# Patient Record
Sex: Female | Born: 1975 | Race: Black or African American | Hispanic: No | Marital: Single | Smoking: Current every day smoker
Health system: Southern US, Community
[De-identification: ages and names within clinical notes are randomized; demographics above are authoritative.]

---

## 2004-09-05 ENCOUNTER — Emergency Department: Payer: Self-pay | Admitting: Internal Medicine

## 2006-08-14 ENCOUNTER — Emergency Department: Payer: Self-pay | Admitting: General Practice

## 2011-05-05 ENCOUNTER — Emergency Department: Payer: Self-pay | Admitting: Emergency Medicine

## 2011-07-10 ENCOUNTER — Ambulatory Visit: Payer: Self-pay | Admitting: Family Medicine

## 2011-08-20 ENCOUNTER — Emergency Department: Payer: Self-pay | Admitting: Emergency Medicine

## 2012-04-21 ENCOUNTER — Emergency Department: Payer: Self-pay | Admitting: Emergency Medicine

## 2014-08-04 ENCOUNTER — Emergency Department (HOSPITAL_COMMUNITY)
Admission: EM | Admit: 2014-08-04 | Discharge: 2014-08-04 | Disposition: A | Discharge status: Home / Self Care | Payer: No Typology Code available for payment source | Attending: Emergency Medicine | Admitting: Emergency Medicine

## 2014-08-04 ENCOUNTER — Emergency Department (HOSPITAL_COMMUNITY): Payer: No Typology Code available for payment source

## 2014-08-04 ENCOUNTER — Encounter (HOSPITAL_COMMUNITY): Payer: Self-pay | Admitting: Emergency Medicine

## 2014-08-04 DIAGNOSIS — Y9389 Activity, other specified: Secondary | ICD-10-CM | POA: Diagnosis not present

## 2014-08-04 DIAGNOSIS — Y998 Other external cause status: Secondary | ICD-10-CM | POA: Insufficient documentation

## 2014-08-04 DIAGNOSIS — Y92410 Unspecified street and highway as the place of occurrence of the external cause: Secondary | ICD-10-CM | POA: Insufficient documentation

## 2014-08-04 DIAGNOSIS — S59901A Unspecified injury of right elbow, initial encounter: Secondary | ICD-10-CM

## 2014-08-04 DIAGNOSIS — S6991XA Unspecified injury of right wrist, hand and finger(s), initial encounter: Secondary | ICD-10-CM | POA: Diagnosis present

## 2014-08-04 MED ORDER — HYDROCODONE-ACETAMINOPHEN 5-325 MG PO TABS: 2.0000 | ORAL_TABLET | ORAL | Status: DC | PRN

## 2014-08-04 MED ORDER — HYDROCODONE-ACETAMINOPHEN 5-325 MG PO TABS
2.0000 | ORAL_TABLET | Freq: Once | ORAL | Status: AC
  Administered 2014-08-04: 2 via ORAL
  Filled 2014-08-04: qty 2

## 2014-08-04 NOTE — Discharge Instructions (Signed)
Rest, ice and elevate your hand for symptom relief. Wear splint as needed for support. Refer to attached documents for more information.
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
## 2014-08-04 NOTE — Progress Notes (Signed)
Orthopedic Tech Progress Note Patient Details:  Alice Peterson 05-05-1976 [id number]  Ortho Devices Type of Ortho Device: Thumb velcro splint Ortho Device/Splint Location: rue Ortho Device/Splint Interventions: Application   [name] 08/04/2014, 8:35 AM

## 2014-08-04 NOTE — ED Notes (Signed)
Ortho at bedside.
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
## 2014-08-13 ENCOUNTER — Emergency Department (INDEPENDENT_AMBULATORY_CARE_PROVIDER_SITE_OTHER)
Admission: EM | Admit: 2014-08-13 | Discharge: 2014-08-13 | Disposition: A | Discharge status: Home / Self Care | Payer: BLUE CROSS/BLUE SHIELD | Source: Home / Self Care | Attending: Family Medicine | Admitting: Family Medicine

## 2014-08-13 ENCOUNTER — Encounter (HOSPITAL_COMMUNITY): Payer: Self-pay

## 2014-08-13 DIAGNOSIS — M5432 Sciatica, left side: Secondary | ICD-10-CM

## 2014-08-13 DIAGNOSIS — M5431 Sciatica, right side: Secondary | ICD-10-CM

## 2014-08-13 MED ORDER — NAPROXEN 500 MG PO TABS: 500.0000 mg | ORAL_TABLET | Freq: Two times a day (BID) | ORAL | Status: DC
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
## 2014-08-13 NOTE — Discharge Instructions (Signed)
Back Exercises Back exercises help treat and prevent back injuries. The goal of back exercises is to increase the strength of your abdominal and back muscles and the flexibility of your back. These exercises should be started when you no longer have back pain. Back exercises include:  Pelvic Tilt. Lie on your back with your knees bent. Tilt your pelvis until the lower part of your back is against the floor. Hold this position 5 to 10 sec and repeat 5 to 10 times.  Knee to Chest. Pull first 1 knee up against your chest and hold for 20 to 30 seconds, repeat this with the other knee, and then both knees. This may be done with the other leg straight or bent, whichever feels better.  Sit-Ups or Curl-Ups. Bend your knees 90 degrees. Start with tilting your pelvis, and do a partial, slow sit-up, lifting your trunk only 30 to 45 degrees off the floor. Take at least 2 to 3 seconds for each sit-up. Do not do sit-ups with your knees out straight. If partial sit-ups are difficult, simply do the above but with only tightening your abdominal muscles and holding it as directed.  Hip-Lift. Lie on your back with your knees flexed 90 degrees. Push down with your feet and shoulders as you raise your hips a couple inches off the floor; hold for 10 seconds, repeat 5 to 10 times.  Back arches. Lie on your stomach, propping yourself up on bent elbows. Slowly press on your hands, causing an arch in your low back. Repeat 3 to 5 times. Any initial stiffness and discomfort should lessen with repetition over time.  Shoulder-Lifts. Lie face down with arms beside your body. Keep hips and torso pressed to floor as you slowly lift your head and shoulders off the floor. Do not overdo your exercises, especially in the beginning. Exercises may cause you some mild back discomfort which lasts for a few minutes; however, if the pain is more severe, or lasts for more than 15 minutes, do not continue exercises until you see your caregiver.  Improvement with exercise therapy for back problems is slow.  See your caregivers for assistance with developing a proper back exercise program. Document Released: 06/25/2004 Document Revised: 08/10/2011 Document Reviewed: 03/19/2011 ExitCare Patient Information 2015 [employer]. This information is not intended to replace advice given to you by your health care provider. Make sure you discuss any questions you have with your health care provider.   Sciatica Sciatica is pain, weakness, numbness, or tingling along the path of the sciatic nerve. The nerve starts in the lower back and runs down the back of each leg. The nerve controls the muscles in the lower leg and in the back of the knee, while also providing sensation to the back of the thigh, lower leg, and the sole of your foot. Sciatica is a symptom of another medical condition. For instance, nerve damage or certain conditions, such as a herniated disk or bone spur on the spine, pinch or put pressure on the sciatic nerve. This causes the pain, weakness, or other sensations normally associated with sciatica. Generally, sciatica only affects one side of the body. CAUSES   Herniated or slipped disc.  Degenerative disk disease.  A pain disorder involving the narrow muscle in the buttocks (piriformis syndrome).  Pelvic injury or fracture.  Pregnancy.  Tumor (rare). SYMPTOMS  Symptoms can vary from mild to very severe. The symptoms usually travel from the low back to the buttocks and down the back   of the leg. Symptoms can include:  Mild tingling or dull aches in the lower back, leg, or hip.  Numbness in the back of the calf or sole of the foot.  Burning sensations in the lower back, leg, or hip.  Sharp pains in the lower back, leg, or hip.  Leg weakness.  Severe back pain inhibiting movement. These symptoms may get worse with coughing, sneezing, laughing, or prolonged sitting or standing. Also, being overweight may worsen  symptoms. DIAGNOSIS  Your caregiver will perform a physical exam to look for common symptoms of sciatica. He or she may ask you to do certain movements or activities that would trigger sciatic nerve pain. Other tests may be performed to find the cause of the sciatica. These may include:  Blood tests.  X-rays.  Imaging tests, such as an MRI or CT scan. TREATMENT  Treatment is directed at the cause of the sciatic pain. Sometimes, treatment is not necessary and the pain and discomfort goes away on its own. If treatment is needed, your caregiver may suggest:  Over-the-counter medicines to relieve pain.  Prescription medicines, such as anti-inflammatory medicine, muscle relaxants, or narcotics.  Applying heat or ice to the painful area.  Steroid injections to lessen pain, irritation, and inflammation around the nerve.  Reducing activity during periods of pain.  Exercising and stretching to strengthen your abdomen and improve flexibility of your spine. Your caregiver may suggest losing weight if the extra weight makes the back pain worse.  Physical therapy.  Surgery to eliminate what is pressing or pinching the nerve, such as a bone spur or part of a herniated disk. HOME CARE INSTRUCTIONS   Only take over-the-counter or prescription medicines for pain or discomfort as directed by your caregiver.  Apply ice to the affected area for 20 minutes, 3-4 times a [place] for the first 48-72 hours. Then try heat in the same way.  Exercise, stretch, or perform your usual activities if these do not aggravate your pain.  Attend physical therapy sessions as directed by your caregiver.  Keep all follow-up appointments as directed by your caregiver.  Do not wear high heels or shoes that do not provide proper support.  Check your mattress to see if it is too soft. A firm mattress may lessen your pain and discomfort. SEEK IMMEDIATE MEDICAL CARE IF:   You lose control of your bowel or bladder  (incontinence).  You have increasing weakness in the lower back, pelvis, buttocks, or legs.  You have redness or swelling of your back.  You have a burning sensation when you urinate.  You have pain that gets worse when you lie down or awakens you at night.  Your pain is worse than you have experienced in the past.  Your pain is lasting longer than 4 weeks.  You are suddenly losing weight without reason. MAKE SURE YOU:  Understand these instructions.  Will watch your condition.  Will get help right away if you are not doing well or get worse. Document Released: 05/12/2001 Document Revised: 11/17/2011 Document Reviewed: 09/27/2011 ExitCare Patient Information 2015 [employer]. This information is not intended to replace advice given to you by your health care provider. Make sure you discuss any questions you have with your health care provider.  

## 2014-09-07 ENCOUNTER — Emergency Department (INDEPENDENT_AMBULATORY_CARE_PROVIDER_SITE_OTHER)
Admission: EM | Admit: 2014-09-07 | Discharge: 2014-09-07 | Disposition: A | Discharge status: Home / Self Care | Payer: BLUE CROSS/BLUE SHIELD | Source: Home / Self Care | Attending: Family Medicine | Admitting: Family Medicine

## 2014-09-07 ENCOUNTER — Encounter (HOSPITAL_COMMUNITY): Payer: Self-pay

## 2014-09-07 DIAGNOSIS — M545 Low back pain, unspecified: Secondary | ICD-10-CM

## 2014-09-07 MED ORDER — CYCLOBENZAPRINE HCL 5 MG PO TABS: 5.0000 mg | ORAL_TABLET | Freq: Every evening | ORAL | Status: AC | PRN

## 2014-09-07 MED ORDER — NAPROXEN 500 MG PO TABS: 500.0000 mg | ORAL_TABLET | Freq: Two times a day (BID) | ORAL | Status: AC

## 2014-09-07 MED ORDER — NAPROXEN 500 MG PO TABS: 500.0000 mg | ORAL_TABLET | Freq: Two times a day (BID) | ORAL | Status: DC

## 2014-09-07 NOTE — Discharge Instructions (Signed)
Thank you for coming in today. °Come back or go to the emergency room if you notice new weakness new numbness problems walking or bowel or bladder problems. ° ° °Back Exercises °These exercises may help you when beginning to rehabilitate your injury. Your symptoms may resolve with or without further involvement from your physician, physical therapist or athletic trainer. While completing these exercises, remember:  °· Restoring tissue flexibility helps normal motion to return to the joints. This allows healthier, less painful movement and activity. °· An effective stretch should be held for at least 30 seconds. °· A stretch should never be painful. You should only feel a gentle lengthening or release in the stretched tissue. °STRETCH - Extension, Prone on Elbows  °· Lie on your stomach on the floor, a bed will be too soft. Place your palms about shoulder width apart and at the height of your head. °· Place your elbows under your shoulders. If this is too painful, stack pillows under your chest. °· Allow your body to relax so that your hips drop lower and make contact more completely with the floor. °· Hold this position for __________ seconds. °· Slowly return to lying flat on the floor. °Repeat __________ times. Complete this exercise __________ times per Kiene.  °RANGE OF MOTION - Extension, Prone Press Ups  °· Lie on your stomach on the floor, a bed will be too soft. Place your palms about shoulder width apart and at the height of your head. °· Keeping your back as relaxed as possible, slowly straighten your elbows while keeping your hips on the floor. You may adjust the placement of your hands to maximize your comfort. As you gain motion, your hands will come more underneath your shoulders. °· Hold this position __________ seconds. °· Slowly return to lying flat on the floor. °Repeat __________ times. Complete this exercise __________ times per Dupuis.  °RANGE OF MOTION- Quadruped, Neutral Spine  °· Assume a hands  and knees position on a firm surface. Keep your hands under your shoulders and your knees under your hips. You may place padding under your knees for comfort. °· Drop your head and point your tail bone toward the ground below you. This will round out your low back like an angry cat. Hold this position for __________ seconds. °· Slowly lift your head and release your tail bone so that your back sags into a large arch, like an old horse. °· Hold this position for __________ seconds. °· Repeat this until you feel limber in your low back. °· Now, find your "sweet spot." This will be the most comfortable position somewhere between the two previous positions. This is your neutral spine. Once you have found this position, tense your stomach muscles to support your low back. °· Hold this position for __________ seconds. °Repeat __________ times. Complete this exercise __________ times per Kronberg.  °STRETCH - Flexion, Single Knee to Chest  °· Lie on a firm bed or floor with both legs extended in front of you. °· Keeping one leg in contact with the floor, bring your opposite knee to your chest. Hold your leg in place by either grabbing behind your thigh or at your knee. °· Pull until you feel a gentle stretch in your low back. Hold __________ seconds. °· Slowly release your grasp and repeat the exercise with the opposite side. °Repeat __________ times. Complete this exercise __________ times per Dolin.  °STRETCH - Hamstrings, Standing °· Stand or sit and extend your right / left   leg, placing your foot on a chair or foot stool °· Keeping a slight arch in your low back and your hips straight forward. °· Lead with your chest and lean forward at the waist until you feel a gentle stretch in the back of your right / left knee or thigh. (When done correctly, this exercise requires leaning only a small distance.) °· Hold this position for __________ seconds. °Repeat __________ times. Complete this stretch __________ times per  Hachey. °STRENGTHENING - Deep Abdominals, Pelvic Tilt  °· Lie on a firm bed or floor. Keeping your legs in front of you, bend your knees so they are both pointed toward the ceiling and your feet are flat on the floor. °· Tense your lower abdominal muscles to press your low back into the floor. This motion will rotate your pelvis so that your tail bone is scooping upwards rather than pointing at your feet or into the floor. °· With a gentle tension and even breathing, hold this position for __________ seconds. °Repeat __________ times. Complete this exercise __________ times per Creswell.  °STRENGTHENING - Abdominals, Crunches  °· Lie on a firm bed or floor. Keeping your legs in front of you, bend your knees so they are both pointed toward the ceiling and your feet are flat on the floor. Cross your arms over your chest. °· Slightly tip your chin down without bending your neck. °· Tense your abdominals and slowly lift your trunk high enough to just clear your shoulder blades. Lifting higher can put excessive stress on the low back and does not further strengthen your abdominal muscles. °· Control your return to the starting position. °Repeat __________ times. Complete this exercise __________ times per Dyck.  °STRENGTHENING - Quadruped, Opposite UE/LE Lift  °· Assume a hands and knees position on a firm surface. Keep your hands under your shoulders and your knees under your hips. You may place padding under your knees for comfort. °· Find your neutral spine and gently tense your abdominal muscles so that you can maintain this position. Your shoulders and hips should form a rectangle that is parallel with the floor and is not twisted. °· Keeping your trunk steady, lift your right hand no higher than your shoulder and then your left leg no higher than your hip. Make sure you are not holding your breath. Hold this position __________ seconds. °· Continuing to keep your abdominal muscles tense and your back steady, slowly return  to your starting position. Repeat with the opposite arm and leg. °Repeat __________ times. Complete this exercise __________ times per Garde. °Document Released: 06/05/2005 Document Revised: 08/10/2011 Document Reviewed: 08/30/2008 °ExitCare® Patient Information ©2015 [employer]. This information is not intended to replace advice given to you by your health care provider. Make sure you discuss any questions you have with your health care provider. ° °
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
## 2014-12-25 ENCOUNTER — Encounter (HOSPITAL_COMMUNITY): Payer: Self-pay | Admitting: Emergency Medicine

## 2014-12-25 ENCOUNTER — Emergency Department (INDEPENDENT_AMBULATORY_CARE_PROVIDER_SITE_OTHER)
Admission: EM | Admit: 2014-12-25 | Discharge: 2014-12-25 | Disposition: A | Discharge status: Home / Self Care | Payer: BLUE CROSS/BLUE SHIELD | Source: Home / Self Care | Attending: Emergency Medicine | Admitting: Emergency Medicine

## 2014-12-25 DIAGNOSIS — T7029XA Other effects of high altitude, initial encounter: Secondary | ICD-10-CM

## 2014-12-25 DIAGNOSIS — H722X1 Other marginal perforations of tympanic membrane, right ear: Secondary | ICD-10-CM

## 2014-12-25 NOTE — Discharge Instructions (Signed)
Eardrum Perforation °The eardrum is a thin, round tissue inside the ear that separates the ear canal from the middle ear. This is the tissue that detects sound and enables you to hear. The eardrum can be punctured or torn (perforated). Eardrums generally heal without help and with little or no permanent hearing loss. °CAUSES  °· Sudden pressure changes that happen in situations like scuba diving or flying in an airplane. °· Foreign objects in the ear. °· Inserting a cotton-tipped swab in the ear. °· Loud noise. °· Trauma to the ear. °SYMPTOMS  °· Hearing loss. °· Ear pain. °· Ringing in the ears. °· Discharge or bleeding from the ear. °· Dizziness. °· Vomiting. °· Facial paralysis. °HOME CARE INSTRUCTIONS  °· Keep your ear dry, as this improves healing. Swimming, diving, and showers are not allowed until healing is complete. While bathing, protect the ear by placing a piece of cotton covered with petroleum jelly in the outer ear canal. °· Only take over-the-counter or prescription medicines for pain, discomfort, or fever as directed by your caregiver. °· Blow your nose gently. Forceful blowing increases the pressure in the middle ear and may cause further injury or delay healing. °· Resume normal activities, such as showering, when the perforation has healed. Your caregiver can let you know when this has occurred. °· Talk to your caregiver before flying on an airplane. Air travel is generally allowed with a perforated eardrum. °· If your caregiver has given you a follow-up appointment, it is very important to keep that appointment. Failure to keep the appointment could result in a chronic or permanent injury, pain, hearing loss, and disability. °SEEK IMMEDIATE MEDICAL CARE IF:  °· You have bleeding or pus coming from your ear. °· You have problems with balance, dizziness, nausea, or vomiting. °· You develop increased pain. °· You have a fever. °MAKE SURE YOU:  °· Understand these instructions. °· Will watch your  condition. °· Will get help right away if you are not doing well or get worse. °Document Released: 05/15/2000 Document Revised: 08/10/2011 Document Reviewed: 05/17/2008 °ExitCare® Patient Information ©2015 [employer]. This information is not intended to replace advice given to you by your health care provider. Make sure you discuss any questions you have with your health care provider. ° °
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
## 2020-01-24 ENCOUNTER — Ambulatory Visit (INDEPENDENT_AMBULATORY_CARE_PROVIDER_SITE_OTHER): Payer: Medicaid Other | Admitting: Advanced Practice Midwife

## 2020-01-24 ENCOUNTER — Other Ambulatory Visit (HOSPITAL_COMMUNITY)
Admission: RE | Admit: 2020-01-24 | Discharge: 2020-01-24 | Disposition: A | Discharge status: Home / Self Care | Payer: Medicaid Other | Source: Ambulatory Visit | Attending: Advanced Practice Midwife | Admitting: Advanced Practice Midwife

## 2020-01-24 ENCOUNTER — Other Ambulatory Visit: Payer: Self-pay

## 2020-01-24 ENCOUNTER — Encounter: Payer: Self-pay | Admitting: Advanced Practice Midwife

## 2020-01-24 VITALS — BP 108/72 | HR 76 | Ht 66.0 in | Wt 198.0 lb

## 2020-01-24 DIAGNOSIS — Z124 Encounter for screening for malignant neoplasm of cervix: Secondary | ICD-10-CM | POA: Diagnosis not present

## 2020-01-24 DIAGNOSIS — Z113 Encounter for screening for infections with a predominantly sexual mode of transmission: Secondary | ICD-10-CM

## 2020-01-24 DIAGNOSIS — Z0000 Encounter for general adult medical examination without abnormal findings: Secondary | ICD-10-CM | POA: Diagnosis present

## 2020-01-24 DIAGNOSIS — Z308 Encounter for other contraceptive management: Secondary | ICD-10-CM

## 2020-01-24 NOTE — Patient Instructions (Signed)
Key Things to Know About COVID-19 Vaccines  Effectiveness  Studies show that COVID-19 vaccines are effective at keeping you from getting COVID-19. Getting a COVID-19 vaccine will also help keep you from getting seriously ill even if you do get COVID-19.  COVID-19 vaccines teach our immune systems how to recognize and fight the virus that causes COVID-19. It typically takes 2 weeks after vaccination for the body to build protection (immunity) against the virus that causes COVID-19. That means it is possible a person could still get COVID-19 before or just after vaccination and then get sick because the vaccine did not have enough time to build protection. People are considered fully vaccinated 2 weeks after their second dose of the Pfizer-BioNTech or Moderna COVID-19 vaccines, or 2 weeks after the single-dose [employer] COVID-19 vaccine.  Safety  Millions of people in the [place] have received COVID-19 vaccines, and these vaccines have undergone the most intensive safety monitoring in [place] history. This monitoring includes using both established and new safety monitoring systems to make sure that COVID-19 vaccines are safe. COVID-19 vaccines cannot give you COVID-19.  You may have side effects after vaccination, but these are normal  After COVID-19 vaccination, you may have some side effects. These are normal signs that your body is building protection. The side effects from COVID-19 vaccination, such as tiredness, headache, or chills, may affect your ability to do daily activities, but they should go away in a few days  Population Immunity  Population immunity makes it hard for a disease to spread from person to person. It even protects those who cannot be vaccinated, like newborns or people who are allergic to a vaccine. The percentage of people who need to have protection to achieve population immunity varies by disease.

## 2020-01-24 NOTE — Progress Notes (Signed)
GYNECOLOGY ANNUAL PREVENTATIVE CARE ENCOUNTER NOTE  Subjective:   Alice Peterson is a 44 y.o. G13P0 female here for a routine annual gynecologic exam.  Current complaints: brown spotting.   Denies abnormal vaginal bleeding, discharge, pelvic pain, problems with intercourse or other gynecologic concerns.   Patient states that she self treated with OTC monistat for a yeast infection. Afterwards she had some brown spotting for a few days. This has since resolved.   Patient reports that she has been doing through menopause around age 36. She reports that her mom also went through menopause in her 27's.   Has had first dose of the pfizer vaccine. Due for dose 2 next week. Planning to get it as scheduled.   Gynecologic History Post-menopausal last period was approx 4 years ago Contraception: none Last Pap: 4-5 years ago. Results were: unsure  Last mammogram: Has not had one. Results were: NA  Obstetric History OB History  Gravida Para Term Preterm AB Living  2         2  SAB TAB Ectopic Multiple Live Births               # Outcome Date GA Lbr Len/2nd Weight Sex Delivery Anes PTL Lv  2 Gravida           1 Gravida             History reviewed. No pertinent past medical history.  History reviewed. No pertinent surgical history.  Current Outpatient Medications on File Prior to Visit  Medication Sig Dispense Refill  . cyclobenzaprine (FLEXERIL) 5 MG tablet Take 1 tablet (5 mg total) by mouth at bedtime as needed for muscle spasms. (Patient not taking: Reported on 01/24/2020) 20 tablet 0  . naproxen (NAPROSYN) 500 MG tablet Take 1 tablet (500 mg total) by mouth 2 (two) times daily with a meal. (Patient not taking: Reported on 01/24/2020) 30 tablet 0  . OVER THE COUNTER MEDICATION Swimmers ear drops-otc (Patient not taking: Reported on 01/24/2020)     No current facility-administered medications on file prior to visit.    Allergies  Allergen Reactions  . Peanut-Containing Drug Products  Anaphylaxis    Pecans only     Social History   Socioeconomic History  . Marital status: Single    Spouse name: Not on file  . Number of children: Not on file  . Years of education: Not on file  . Highest education level: Not on file  Occupational History  . Not on file  Tobacco Use  . Smoking status: Current Every [place] Smoker    Types: Cigarettes  . Smokeless tobacco: Never Used  Vaping Use  . Vaping Use: Never used  Substance and Sexual Activity  . Alcohol use: Yes  . Drug use: No  . Sexual activity: Not Currently    Partners: Male    Birth control/protection: None  Other Topics Concern  . Not on file  Social History Narrative  . Not on file   Social Determinants of Health   Financial Resource Strain:   . Difficulty of Paying Living Expenses: Not on file  Food Insecurity:   . Worried About Programme researcher, broadcasting/film/video in the Last Year: Not on file  . Ran Out of Food in the Last Year: Not on file  Transportation Needs:   . Lack of Transportation (Medical): Not on file  . Lack of Transportation (Non-Medical): Not on file  Physical Activity:   . Days of Exercise per Week: Not  on file  . Minutes of Exercise per Session: Not on file  Stress:   . Feeling of Stress : Not on file  Social Connections:   . Frequency of Communication with Friends and Family: Not on file  . Frequency of Social Gatherings with Friends and Family: Not on file  . Attends Religious Services: Not on file  . Active Member of Clubs or Organizations: Not on file  . Attends Banker Meetings: Not on file  . Marital Status: Not on file  Intimate Partner Violence:   . Fear of Current or Ex-Partner: Not on file  . Emotionally Abused: Not on file  . Physically Abused: Not on file  . Sexually Abused: Not on file    No family history on file.  The following portions of the patient's history were reviewed and updated as appropriate: allergies, current medications, past family history, past  medical history, past social history, past surgical history and problem list.  Review of Systems Pertinent items noted in HPI and remainder of comprehensive ROS otherwise negative.   Objective:  BP 108/72   Pulse 76   Ht 5\' 6"  (1.676 m)   Wt 198 lb (89.8 kg)   BMI 31.96 kg/m  CONSTITUTIONAL: Well-developed, well-nourished female in no acute distress.  HENT:  Normocephalic, atraumatic, External right and left ear normal. Oropharynx is clear and moist EYES: Conjunctivae and EOM are normal. Pupils are equal, round, and reactive to light. No scleral icterus.  NECK: Normal range of motion, supple, no masses.  Normal thyroid.  SKIN: Skin is warm and dry. No rash noted. Not diaphoretic. No erythema. No pallor. NEUROLOGIC: Alert and oriented to person, place, and time. Normal reflexes, muscle tone coordination. No cranial nerve deficit noted. PSYCHIATRIC: Normal mood and affect. Normal behavior. Normal judgment and thought content. CARDIOVASCULAR: Normal heart rate noted, regular rhythm RESPIRATORY: Clear to auscultation bilaterally. Effort and breath sounds normal, no problems with respiration noted. BREASTS: Symmetric in size. No masses, skin changes, nipple drainage, or lymphadenopathy. ABDOMEN: Soft, normal bowel sounds, no distention noted.  No tenderness, rebound or guarding.  PELVIC: Normal appearing external genitalia; normal appearing vaginal mucosa and cervix.  No abnormal discharge noted.  Pap smear obtained.  Normal uterine size, no other palpable masses, no uterine or adnexal tenderness. MUSCULOSKELETAL: Normal range of motion. No tenderness.  No cyanosis, clubbing, or edema.  2+ distal pulses.   Assessment and Plan:  1. Screening for cervical cancer - Cytology - PAP  2. Encounter for annual physical examination excluding gynecological examination in a patient older than 17 years - Cervicovaginal ancillary only( [place]) - MM DIGITAL SCREENING BILATERAL; Future - Covid  vaccine recommended   Will follow up results of pap smear and manage accordingly. Mammogram scheduled Routine preventative health maintenance measures emphasized. Please refer to After Visit Summary for other counseling recommendations.    DNP, CNM  01/24/20  10:40 AM

## 2020-01-24 NOTE — Progress Notes (Signed)
NGYN patient presents for Annual Exam.  Last Pap: 4 yrs ago WNL  LMP: No cycle in 4 yrs  Contraception: None  STD Screening: Desires Full Panel.  Mammogram:  Never  Family Hx of Breast Cancer:  None   CC: Patient states x 1 month ago  She had a vaginal yeast infection pt tired OTC monistat sx's recurred. pt then began to have spotting this past July after not having a cycle in 4 yrs blood was "Brownish" color. Pt notes vaginal odor as well wants vaginal swab.

## 2020-01-25 LAB — CYTOLOGY - PAP
Adequacy: ABSENT
Comment: NEGATIVE
Diagnosis: NEGATIVE
High risk HPV: NEGATIVE

## 2020-01-25 LAB — CERVICOVAGINAL ANCILLARY ONLY
Bacterial Vaginitis (gardnerella): POSITIVE — AB
Candida Glabrata: NEGATIVE
Candida Vaginitis: NEGATIVE
Chlamydia: NEGATIVE
Comment: NEGATIVE
Comment: NEGATIVE
Comment: NEGATIVE
Comment: NEGATIVE
Comment: NEGATIVE
Comment: NORMAL
Neisseria Gonorrhea: NEGATIVE
Trichomonas: POSITIVE — AB

## 2020-01-25 LAB — RPR: RPR Ser Ql: NONREACTIVE

## 2020-01-25 LAB — HIV ANTIBODY (ROUTINE TESTING W REFLEX): HIV Screen 4th Generation wRfx: NONREACTIVE

## 2020-01-28 ENCOUNTER — Encounter: Payer: Self-pay | Admitting: Advanced Practice Midwife

## 2020-01-30 ENCOUNTER — Other Ambulatory Visit: Payer: Self-pay | Admitting: *Deleted

## 2020-01-30 MED ORDER — METRONIDAZOLE 500 MG PO TABS: 2000.0000 mg | ORAL_TABLET | Freq: Once | ORAL | 0 refills | Status: AC

## 2020-01-30 NOTE — Progress Notes (Signed)
2 gram flagyl sent to pharmacy. See lab result.
